# Patient Record
Sex: Male | Born: 1950 | Race: White | Hispanic: No | Smoking: Current every day smoker
Health system: Southern US, Community
[De-identification: ages and names within clinical notes are randomized; demographics above are authoritative.]

## PROBLEM LIST (undated history)

## (undated) DIAGNOSIS — I10 Essential (primary) hypertension: Secondary | ICD-10-CM

## (undated) DIAGNOSIS — G500 Trigeminal neuralgia: Secondary | ICD-10-CM

---

## 2016-03-03 ENCOUNTER — Encounter (HOSPITAL_COMMUNITY): Payer: Self-pay | Admitting: Emergency Medicine

## 2016-03-03 ENCOUNTER — Emergency Department (HOSPITAL_COMMUNITY)
Admission: EM | Admit: 2016-03-03 | Discharge: 2016-03-03 | Disposition: A | Discharge status: Home / Self Care | Payer: Worker's Compensation | Attending: Emergency Medicine | Admitting: Emergency Medicine

## 2016-03-03 ENCOUNTER — Emergency Department (HOSPITAL_COMMUNITY): Payer: Worker's Compensation

## 2016-03-03 DIAGNOSIS — Y9389 Activity, other specified: Secondary | ICD-10-CM | POA: Insufficient documentation

## 2016-03-03 DIAGNOSIS — Y9289 Other specified places as the place of occurrence of the external cause: Secondary | ICD-10-CM | POA: Insufficient documentation

## 2016-03-03 DIAGNOSIS — S0990XA Unspecified injury of head, initial encounter: Secondary | ICD-10-CM | POA: Insufficient documentation

## 2016-03-03 DIAGNOSIS — I10 Essential (primary) hypertension: Secondary | ICD-10-CM | POA: Insufficient documentation

## 2016-03-03 DIAGNOSIS — S8262XA Displaced fracture of lateral malleolus of left fibula, initial encounter for closed fracture: Secondary | ICD-10-CM | POA: Diagnosis not present

## 2016-03-03 DIAGNOSIS — W07XXXA Fall from chair, initial encounter: Secondary | ICD-10-CM | POA: Insufficient documentation

## 2016-03-03 DIAGNOSIS — Z8669 Personal history of other diseases of the nervous system and sense organs: Secondary | ICD-10-CM | POA: Insufficient documentation

## 2016-03-03 DIAGNOSIS — Z9889 Other specified postprocedural states: Secondary | ICD-10-CM

## 2016-03-03 DIAGNOSIS — Y998 Other external cause status: Secondary | ICD-10-CM | POA: Insufficient documentation

## 2016-03-03 DIAGNOSIS — S82892A Other fracture of left lower leg, initial encounter for closed fracture: Secondary | ICD-10-CM

## 2016-03-03 DIAGNOSIS — S99912A Unspecified injury of left ankle, initial encounter: Secondary | ICD-10-CM | POA: Diagnosis present

## 2016-03-03 DIAGNOSIS — Z8781 Personal history of (healed) traumatic fracture: Secondary | ICD-10-CM

## 2016-03-03 DIAGNOSIS — F17210 Nicotine dependence, cigarettes, uncomplicated: Secondary | ICD-10-CM | POA: Diagnosis not present

## 2016-03-03 HISTORY — DX: Trigeminal neuralgia: G50.0

## 2016-03-03 HISTORY — DX: Essential (primary) hypertension: I10

## 2016-03-03 MED ORDER — DIAZEPAM 5 MG PO TABS: 5.0000 mg | ORAL_TABLET | Freq: Four times a day (QID) | ORAL | Status: AC | PRN

## 2016-03-03 MED ORDER — FENTANYL CITRATE (PF) 100 MCG/2ML IJ SOLN
50.0000 ug | Freq: Once | INTRAMUSCULAR | Status: AC
  Administered 2016-03-03: 50 ug via INTRAVENOUS
  Filled 2016-03-03: qty 2

## 2016-03-03 MED ORDER — OXYCODONE-ACETAMINOPHEN 5-325 MG PO TABS: 1.0000 | ORAL_TABLET | ORAL | Status: AC | PRN

## 2016-03-03 MED ORDER — FENTANYL CITRATE (PF) 100 MCG/2ML IJ SOLN
INTRAMUSCULAR | Status: AC
  Filled 2016-03-03: qty 2

## 2016-03-03 MED ORDER — ETOMIDATE 2 MG/ML IV SOLN
INTRAVENOUS | Status: AC | PRN
  Administered 2016-03-03: 5 mg via INTRAVENOUS
  Administered 2016-03-03: 2 mg via INTRAVENOUS

## 2016-03-03 MED ORDER — DIAZEPAM 5 MG/ML IJ SOLN
2.5000 mg | Freq: Once | INTRAMUSCULAR | Status: AC
  Administered 2016-03-03: 2.5 mg via INTRAVENOUS
  Filled 2016-03-03: qty 2

## 2016-03-03 MED ORDER — ETOMIDATE 2 MG/ML IV SOLN
5.0000 mg | Freq: Once | INTRAVENOUS | Status: DC
  Filled 2016-03-03: qty 10

## 2016-03-03 MED ORDER — FENTANYL CITRATE (PF) 100 MCG/2ML IJ SOLN
INTRAMUSCULAR | Status: AC | PRN
  Administered 2016-03-03: 100 ug via INTRAVENOUS

## 2016-03-03 NOTE — Progress Notes (Signed)
Orthopedic Tech Progress Note Patient Details:  Lupe CarneyCurtiss Gaughan 1951/08/07 [id number]  Ortho Devices Type of Ortho Device: Ace wrap, Post (short leg) splint, Stirrup splint Ortho Device/Splint Location: llu Ortho Device/Splint Interventions: Application Ankle reduction  Nikki DomCrawford, Crystalynn Mcinerney 03/03/2016, 2:37 PM

## 2016-03-03 NOTE — ED Provider Notes (Signed)
CSN: [id number]     Arrival date & time    History   First MD Initiated Contact with Patient 03/03/16 1043     Chief Complaint  Patient presents with  . Fall     (Consider location/radiation/quality/duration/timing/severity/associated sxs/prior Treatment) HPI Alexander Shaw is a 65 y.o. male with hx of htn, not on any medications, presents to ED with complaint of a fall. Pt states heWas unloading heavy boxes from a truck, states he was on the day step stool, approximately 2 feet up from the ground and was pulling a heavy box from the overhead. He states the box came out a little too quickly and he lost his balance falling backwards. He states he tried to step down on the stool, but twisted his left ankle and fell down possibly hitting his head on the ground. He reports that he immediately felt embarrassed, and felt no pain. He states he tried to get up, but when he was getting up he noticed that his left ankle was painful and started to swell. He states he then started feeling slightly lightheaded and dizzy. He said down on a stool, and felt better. He denies losing consciousness completely. At this time he reports just a mild headache, he denies any dizziness or lightheadedness, denies any nausea or vomiting, denies any neck pain. He denies any other injuries. He states his only complaint is left ankle pain and swelling. He denies any prior ankle injuries. No knee pain. Ice pack was applied by EMS and patient was given 100 g of fentanyl and route. States pain much improved.   Past Medical History  Diagnosis Date  . Hypertension   . Trigeminal neuralgia    History reviewed. No pertinent past surgical history. History reviewed. No pertinent family history. Social History  Substance Use Topics  . Smoking status: Current Every Day Smoker -- 0.75 packs/day    Types: Cigarettes  . Smokeless tobacco: None  . Alcohol Use: No    Review of Systems  Constitutional: Negative for fever and chills.   Respiratory: Negative for cough, chest tightness and shortness of breath.   Cardiovascular: Negative for chest pain, palpitations and leg swelling.  Gastrointestinal: Negative for nausea, vomiting, abdominal pain, diarrhea and abdominal distention.  Musculoskeletal: Positive for joint swelling and arthralgias. Negative for neck pain and neck stiffness.  Skin: Negative for rash.  Allergic/Immunologic: Negative for immunocompromised state.  Neurological: Positive for headaches. Negative for dizziness, syncope, weakness, light-headedness and numbness.  All other systems reviewed and are negative.     Allergies  Review of patient's allergies indicates no known allergies.  Home Medications   Prior to Admission medications   Not on File   BP 140/89 mmHg  Temp(Src) 97.7 F (36.5 C) (Oral)  Resp 11  Ht 6' (1.829 m)  Wt 96.163 kg  BMI 28.75 kg/m2  SpO2 95% Physical Exam  Constitutional: He is oriented to person, place, and time. He appears well-developed and well-nourished. No distress.  HENT:  Head: Normocephalic and atraumatic.  Right Ear: External ear normal.  Left Ear: External ear normal.  Mouth/Throat: Oropharynx is clear and moist.  No hemotympanum bilaterally  Eyes: Conjunctivae are normal.  Neck: Normal range of motion. Neck supple.  No midline cervical spine tenderness. Full range of motion of the neck with no pain. Stable with strength intact against resistance in all directions  Cardiovascular: Normal rate, regular rhythm and normal heart sounds.   Pulmonary/Chest: Effort normal. No respiratory distress. He has no wheezes.  He has no rales.  Abdominal: Soft. Bowel sounds are normal. He exhibits no distension. There is no tenderness. There is no rebound.  Musculoskeletal: He exhibits no edema.  Swelling noted to the left lateral malleolus of the ankle. Pain with any range of motion of the ankle joint. Tender to palpation over lateral malleolus. Achilles tendon is  nontender and is intact. No tenderness over medial malleolus. No tenderness over the foot. Toes are pink and warm, cap refill less than 2 seconds. No tenderness to palpation over the knee joint, full range motion of the knee.  Neurological: He is alert and oriented to person, place, and time. No cranial nerve deficit. Coordination normal.  5/5 and equal upper and lower extremity strength bilaterally. Equal grip strength bilaterally. Normal finger to nose and heel to shin. No pronator drift.   Skin: Skin is warm and dry.  Nursing note and vitals reviewed.   ED Course  Procedures (including critical care time) Labs Review Labs Reviewed - No data to display  Imaging Review Dg Ankle Complete Left  03/03/2016  CLINICAL DATA:  Fall with ankle twisting.  Pain and swelling EXAM: LEFT ANKLE COMPLETE - 3+ VIEW COMPARISON:  None. FINDINGS: Oblique fracture through the distal fibula at the meta diaphyseal junction. There is 1 bone width lateral displacement of the distal fracture fragment. The ankle mortise is disrupted with the talus subluxed laterally in relation to the tibia by 12 mm. The calcaneus normal. No total fracture identified. IMPRESSION: 1. Displaced fracture of the distal fibula. 2. Disruption of the ankle mortise with lateral subluxation of talus. Electronically Signed   By: Genevive BiStewart  Edmunds M.D.   On: 03/03/2016 12:04   Dg Ankle Left Port  03/03/2016  CLINICAL DATA:  Status post ankle reduction EXAM: PORTABLE LEFT ANKLE - 2 VIEW COMPARISON:  03/03/2016 FINDINGS: Splinting material is now seen. The previously seen ankle fracture and subluxation has been reduced. The degree of widening in the tibiotalar space laterally now measures approximately 6 mm decreased from 13 mm. The fibular fracture has also been reduced. IMPRESSION: Reduction of previously seen fracture subluxation. Electronically Signed   By: Alcide CleverMark  Lukens M.D.   On: 03/03/2016 14:51   I have personally reviewed and evaluated these  images and lab results as part of my medical decision-making.   EKG Interpretation None      MDM   Final diagnoses:  Status post open reduction with internal fixation (ORIF) of fracture of ankle  Ankle fracture, left, closed, initial encounter   Patient emergency department with a mechanical fall off of a stool while moving boxes. He has injury to the left ankle. He is unsure if he hit his head, however denies any immediate loss of consciousness, vomiting, confusion, amnesia, dizziness. Patient denies any neck pain. He has no midline tenderness, full range of motion of the neck. Stable against resistance in all directions. His neurovascular intact, normal neuro exam. At this time I do not think patient needs any further imaging of his head and cervical spine. He is not on any anticoagulants. Will get x-ray of the left ankle. Patient's pain is well-managed at this time  12:52 PM Xray as described above. Spoke with Dr. Eulah PontMurphy through his nurse (he is in OR), advised to reduce in ED, splint, follow up with him the office tomorrow at 8:30 am. He will look at post reduction films.   3:58 PM Ankle appears much better on post reduction film. Cap refill <2sec after splinting. Pain improved.  Home with percocet, valium follow up with Dr. Eulah Pont tomorrow. Dr. Eulah Pont looked at post reduction films.   Filed Vitals:   03/03/16 1500 03/03/16 1515 03/03/16 1530 03/03/16 1545  BP: 129/78 122/81 138/77 136/77  Pulse: 58 53 59 55  Temp:      TempSrc:      Resp: Height:      Weight:      SpO2: 94% 98% 95% 96%     Jaynie Crumble, PA-C 03/03/16 1559  Zadie Rhine, MD 03/03/16 1616

## 2016-03-03 NOTE — ED Provider Notes (Signed)
Procedural sedation Performed by: Joya GaskinsWICKLINE,Raynette Arras W Consent: Verbal consent obtained. Risks and benefits: risks, benefits and alternatives were discussed Required items: required devices, and special equipment available Patient identity confirmed: arm band and provided demographic data Time out: Immediately prior to procedure a "time out" was called to verify the correct patient, procedure, equipment, support staff and site/side marked as required.  Sedation type: moderate (conscious) sedation NPO time confirmed and considedered  Sedatives: ETOMIDATE  Physician Time at Bedside: 16  Vitals: Vital signs were monitored during sedation. Cardiac Monitor, pulse oximeter Patient tolerance: Patient tolerated the procedure well with no immediate complications. Comments: Pt with uneventful recovered. Returned to pre-procedural sedation baseline    SPLINT APPLICATION Date/Time: 1445 Authorized by: Joya GaskinsWICKLINE,Niall Illes W Consent: Verbal consent obtained. Risks and benefits: risks, benefits and alternatives were discussed Consent given by: patient Splint applied by: orthopedic technician Location details: left ankle Splint type: posterior/stirrup Supplies used: ortho-glass Post-procedure: The splinted body part was neurovascularly unchanged following the procedure. Patient tolerance: Patient tolerated the procedure well with no immediate complications.     Zadie Rhineonald Noriel Guthrie, MD 03/03/16 [phone]

## 2016-03-03 NOTE — Discharge Instructions (Signed)
Keep your leg elevated at home. Ice several times a day. Take percocet as prescribed as needed for pain. Take valium for muscle spasms. Follow up with Dr. Eulah Pont tomorrow at 8:30am.   Ankle Fracture A fracture is a break in a bone. The ankle joint is made up of three bones. These include the lower (distal)sections of your lower leg bones, called the tibia and fibula, along with a bone in your foot, called the talus. Depending on how bad the break is and if more than one ankle joint bone is broken, a cast or splint is used to protect and keep your injured bone from moving while it heals. Sometimes, surgery is required to help the fracture heal properly.  There are two general types of fractures:  Stable fracture. This includes a single fracture line through one bone, with no injury to ankle ligaments. A fracture of the talus that does not have any displacement (movement of the bone on either side of the fracture line) is also stable.  Unstable fracture. This includes more than one fracture line through one or more bones in the ankle joint. It also includes fractures that have displacement of the bone on either side of the fracture line. CAUSES  A direct blow to the ankle.   Quickly and severely twisting your ankle.  Trauma, such as a car accident or falling from a significant height. RISK FACTORS You may be at a higher risk of ankle fracture if:  You have certain medical conditions.  You are involved in high-impact sports.  You are involved in a high-impact car accident. SIGNS AND SYMPTOMS   Tender and swollen ankle.  Bruising around the injured ankle.  Pain on movement of the ankle.  Difficulty walking or putting weight on the ankle.  A cold foot below the site of the ankle injury. This can occur if the blood vessels passing through your injured ankle were also damaged.  Numbness in the foot below the site of the ankle injury. DIAGNOSIS  An ankle fracture is usually diagnosed  with a physical exam and X-rays. A CT scan may also be required for complex fractures. TREATMENT  Stable fractures are treated with a cast or splint and using crutches to avoid putting weight on your injured ankle. This is followed by an ankle strengthening program. Some patients require a special type of cast, depending on other medical problems they may have. Unstable fractures require surgery to ensure the bones heal properly. Your health care provider will tell you what type of fracture you have and the best treatment for your condition. HOME CARE INSTRUCTIONS   Review correct crutch use with your health care provider and use your crutches as directed. Safe use of crutches is extremely important. Misuse of crutches can cause you to fall or cause injury to nerves in your hands or armpits.  Do not put weight or pressure on the injured ankle until directed by your health care provider.  To lessen the swelling, keep the injured leg elevated while sitting or lying down.  Apply ice to the injured area:  Put ice in a plastic bag.  Place a towel between your cast and the bag.  Leave the ice on for 20 minutes, 2-3 times a day.  If you have a plaster or fiberglass cast:  Do not try to scratch the skin under the cast with any objects. This can increase your risk of skin infection.  Check the skin around the cast every day. You may  put lotion on any red or sore areas.  Keep your cast dry and clean.  If you have a plaster splint:  Wear the splint as directed.  You may loosen the elastic around the splint if your toes become numb, tingle, or turn cold or blue.  Do not put pressure on any part of your cast or splint; it may break. Rest your cast only on a pillow the first 24 hours until it is fully hardened.  Your cast or splint can be protected during bathing with a plastic bag sealed to your skin with medical tape. Do not lower the cast or splint into water.  Take medicines as directed by  your health care provider. Only take over-the-counter or prescription medicines for pain, discomfort, or fever as directed by your health care provider.  Do not drive a vehicle until your health care provider specifically tells you it is safe to do so.  If your health care provider has given you a follow-up appointment, it is very important to keep that appointment. Not keeping the appointment could result in a chronic or permanent injury, pain, and disability. If you have any problem keeping the appointment, call the facility for assistance. SEEK MEDICAL CARE IF: You develop increased swelling or discomfort. SEEK IMMEDIATE MEDICAL CARE IF:   Your cast gets damaged or breaks.  You have continued severe pain.  You develop new pain or swelling after the cast was put on.  Your skin or toenails below the injury turn blue or gray.  Your skin or toenails below the injury feel cold, numb, or have loss of sensitivity to touch.  There is a bad smell or pus draining from under the cast. MAKE SURE YOU:   Understand these instructions.  Will watch your condition.  Will get help right away if you are not doing well or get worse.   This information is not intended to replace advice given to you by your health care provider. Make sure you discuss any questions you have with your health care provider.   Document Released: 10/28/2000 Document Revised: 11/05/2013 Document Reviewed: 05/30/2013 [employer] Interactive Patient Education [employer]

## 2016-03-03 NOTE — ED Notes (Signed)
Pt was unloading boxes in a truck, lost his balance and fell off of a stool. Approx 2 foot drop. Pt twisted left ankle when he fell. After fall, pt had a syncopal episode. EMS reports pt is alert and oriented. EMS gave fentanyl. BP 125/80, HR 80.

## 2017-05-23 IMAGING — DX DG ANKLE COMPLETE 3+V*L*
3 series · 3 of 3 positions shown · non-contrast
Comparison: None.

CLINICAL DATA: Fall with ankle twisting.  Pain and swelling

EXAM:
LEFT ANKLE COMPLETE - 3+ VIEW

[ankle ap]
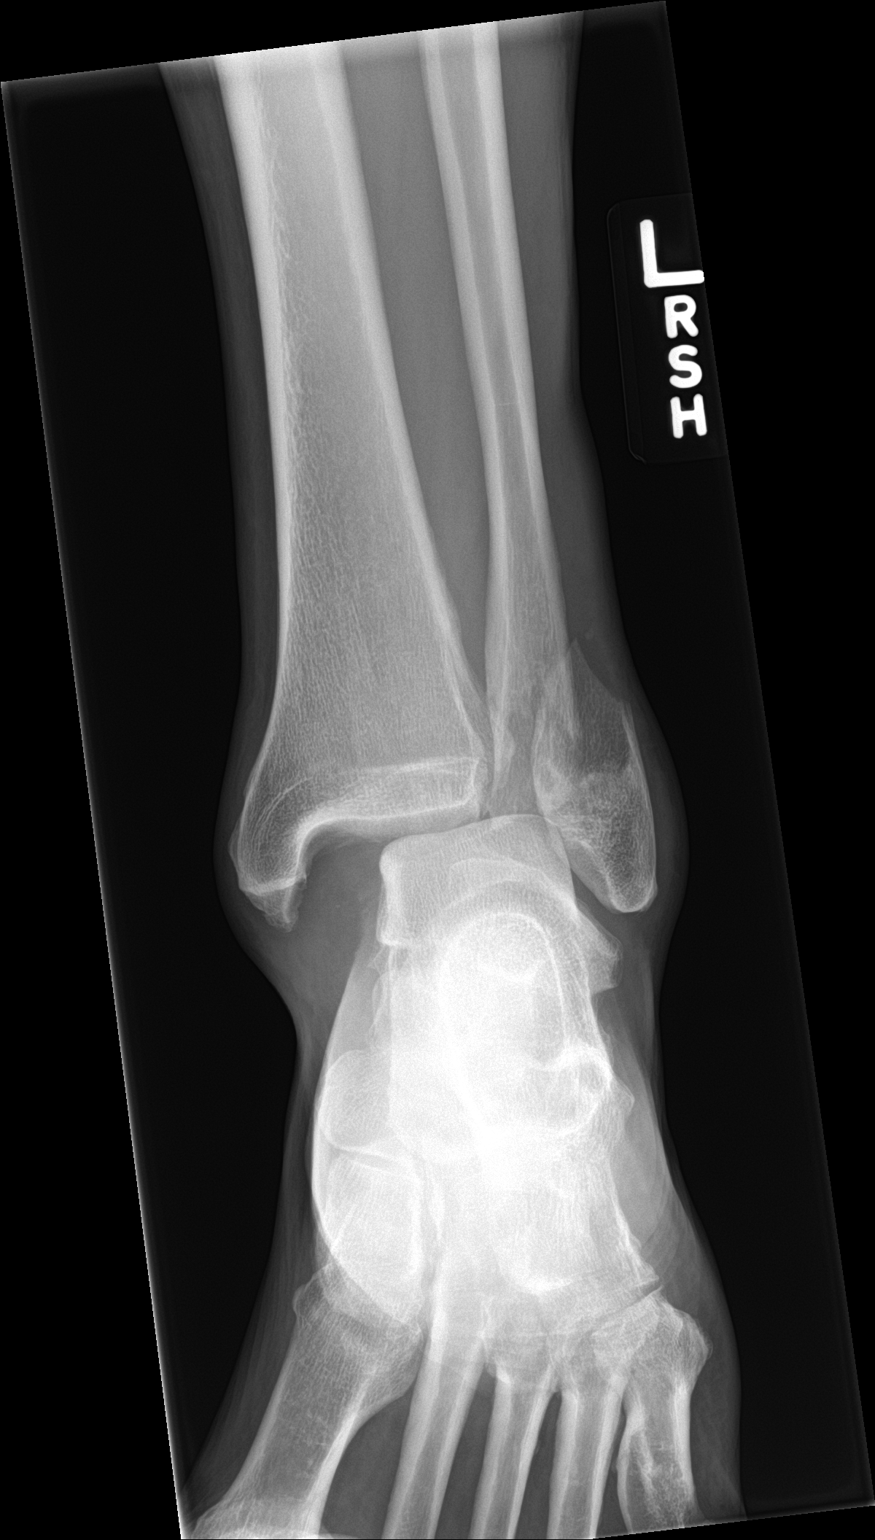

[ankle obl]
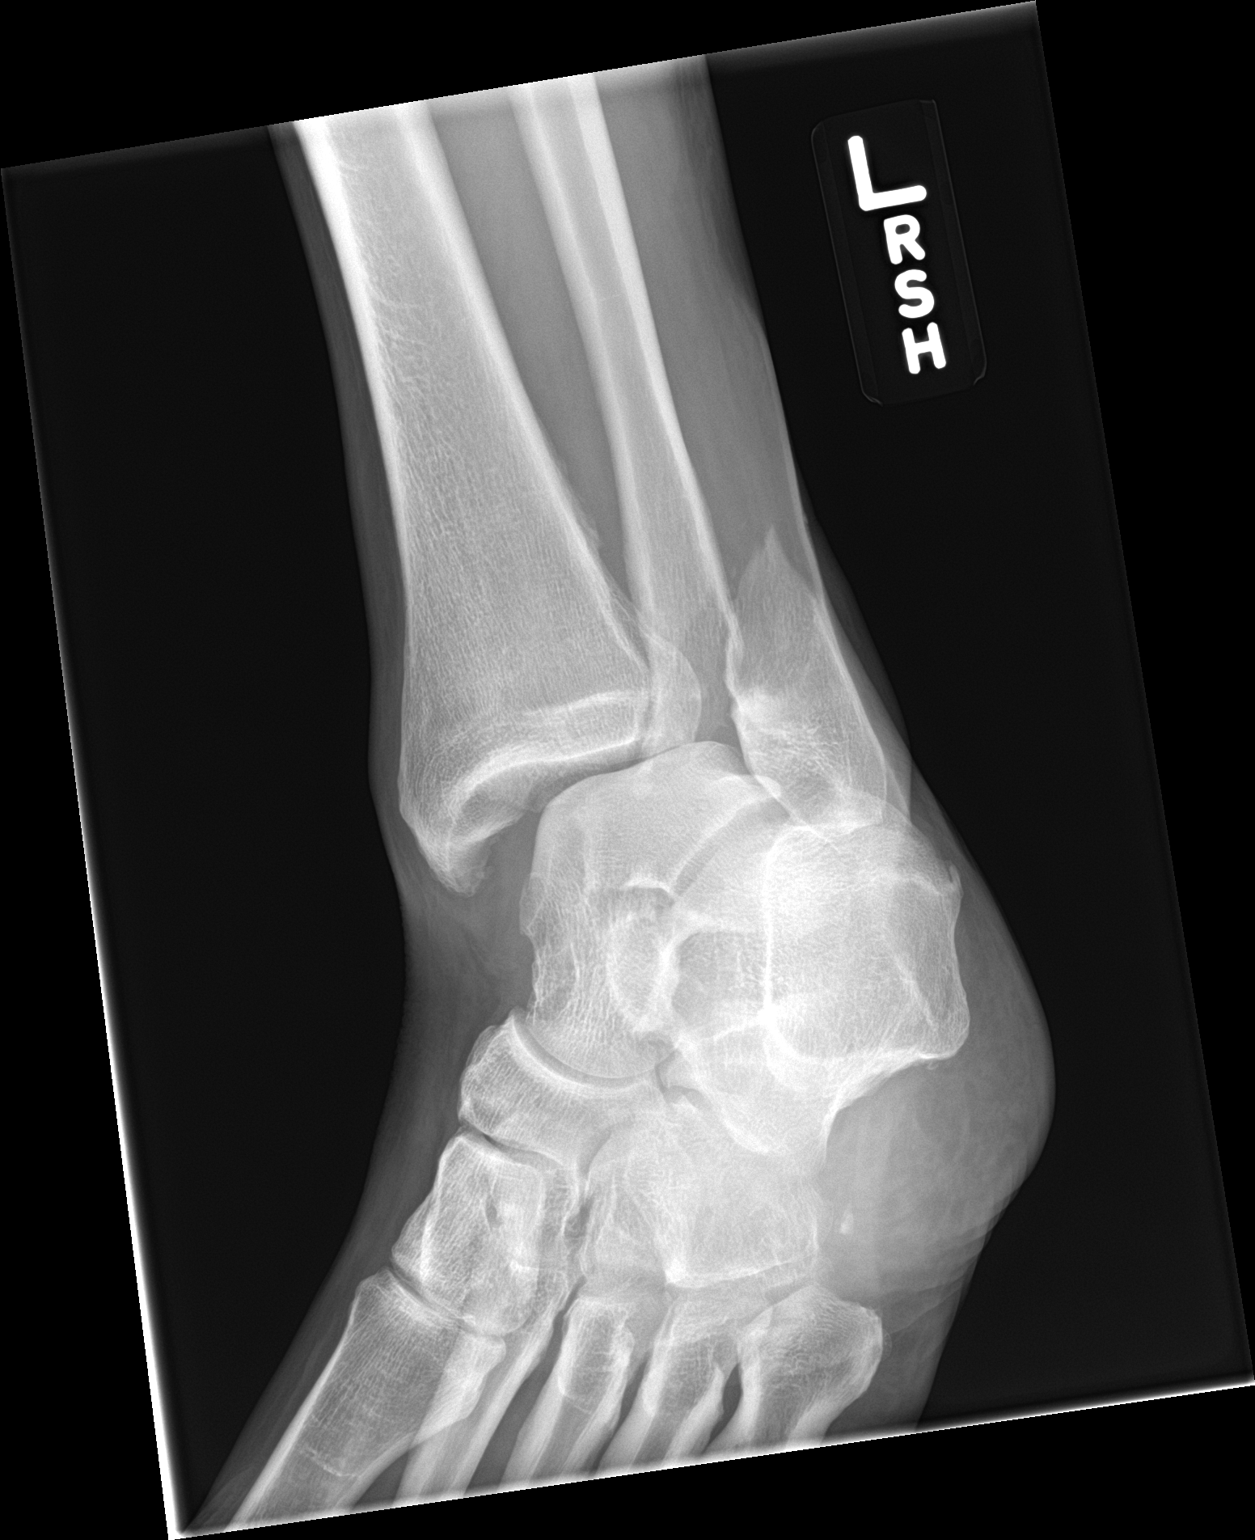

[ankle lat]
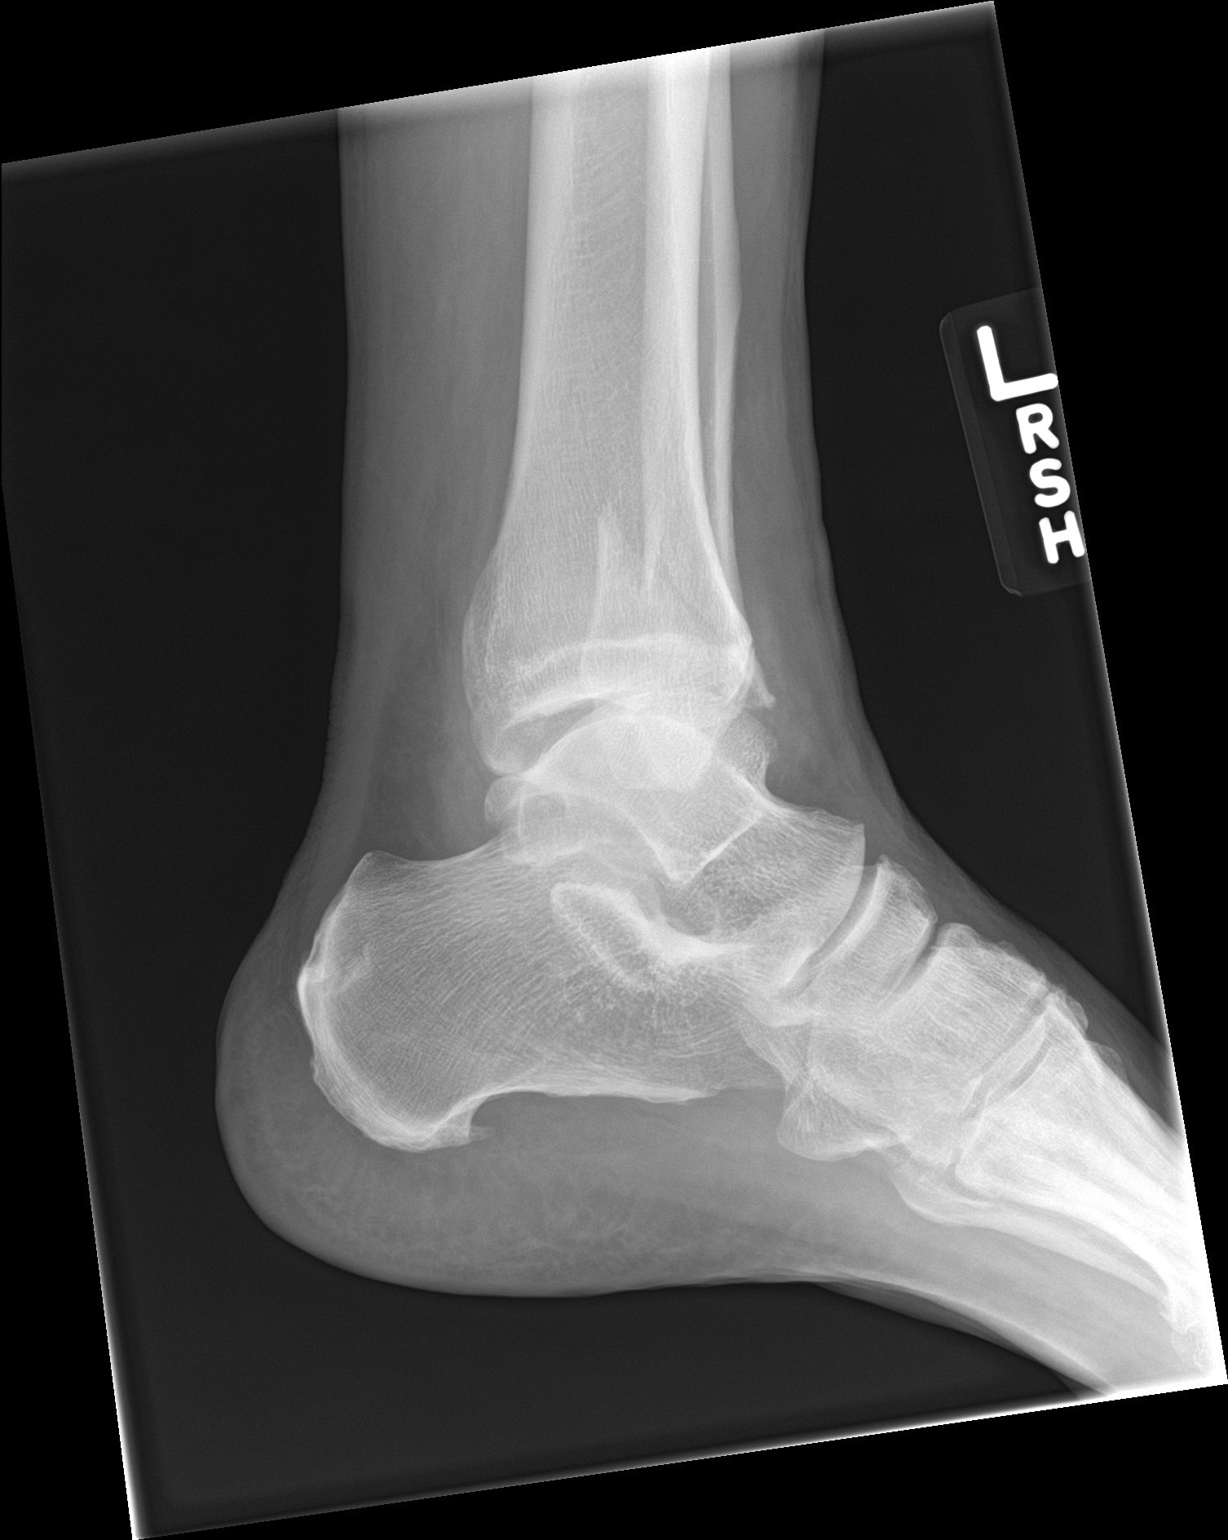

[3 of 3 positions shown; findings below may reference images not displayed]

FINDINGS: Oblique fracture through the distal fibula at the meta diaphyseal
junction. There is 1 bone width lateral displacement of the distal
fracture fragment. The ankle mortise is disrupted with the talus
subluxed laterally in relation to the tibia by 12 mm. The calcaneus
normal. No total fracture identified.
IMPRESSION: 1. Displaced fracture of the distal fibula.
2. Disruption of the ankle mortise with lateral subluxation of
talus.

## 2019-12-25 ENCOUNTER — Ambulatory Visit: Payer: Self-pay | Attending: Internal Medicine

## 2019-12-25 DIAGNOSIS — Z20822 Contact with and (suspected) exposure to covid-19: Secondary | ICD-10-CM | POA: Insufficient documentation

## 2019-12-26 LAB — NOVEL CORONAVIRUS, NAA: SARS-CoV-2, NAA: NOT DETECTED
